# Patient Record
Sex: Male | Born: 1995 | Race: White | Hispanic: No | Marital: Single | Smoking: Never smoker
Health system: Southern US, Community
[De-identification: ages and names within clinical notes are randomized; demographics above are authoritative.]

---
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
## 2017-03-20 ENCOUNTER — Other Ambulatory Visit: Payer: Self-pay | Admitting: Occupational Medicine

## 2017-03-20 ENCOUNTER — Ambulatory Visit: Payer: Self-pay

## 2017-03-20 DIAGNOSIS — M25562 Pain in left knee: Secondary | ICD-10-CM

## 2020-04-13 ENCOUNTER — Other Ambulatory Visit: Payer: Self-pay

## 2020-04-13 ENCOUNTER — Ambulatory Visit: Admit: 2020-04-13 | Discharge status: Absent without leave | Payer: Self-pay

## 2020-04-13 ENCOUNTER — Ambulatory Visit
Admission: EM | Admit: 2020-04-13 | Discharge: 2020-04-13 | Disposition: A | Discharge status: Home / Self Care | Payer: Self-pay

## 2020-04-13 DIAGNOSIS — J329 Chronic sinusitis, unspecified: Secondary | ICD-10-CM

## 2020-04-13 DIAGNOSIS — R0981 Nasal congestion: Secondary | ICD-10-CM

## 2020-04-13 DIAGNOSIS — R519 Headache, unspecified: Secondary | ICD-10-CM

## 2020-04-13 DIAGNOSIS — B9789 Other viral agents as the cause of diseases classified elsewhere: Secondary | ICD-10-CM

## 2020-04-13 NOTE — ED Provider Notes (Signed)
[facility]   [id number] 04/13/20 Arrival Time: 1559  MV:[facility] THROAT  SUBJECTIVE: History from: patient.  John Sutton is a 24 y.o. male who presents with abrupt onset of nasal congestion with dark green and brown nasal discharge intermittently and intermittent headache for the last 2 days. Had Covid last month.  Denies sick exposure to Covid, strep, flu or mono, or precipitating event. Has not had Covid vaccines. Has not attempted OTC treatment. Has not had flu vaccine this year. There are no aggravating symptoms. Denies previous symptoms in the past.     Denies fever, chills, ear pain, sinus pain, rhinorrhea, cough, SOB, wheezing, chest pain, nausea, rash, changes in bowel or bladder habits.     ROS: As per HPI.  All other pertinent ROS negative.     History reviewed. No pertinent past medical history. History reviewed. No pertinent surgical history. Allergies  Allergen Reactions  . Strawberry Extract Rash   No current facility-administered medications on file prior to encounter.   Current Outpatient Medications on File Prior to Encounter  Medication Sig Dispense Refill  . ibuprofen (ADVIL,MOTRIN) 800 MG tablet Take 1 tablet (800 mg total) by mouth 3 (three) times daily. 21 tablet 0   Social History   Socioeconomic History  . Marital status: Single    Spouse name: Not on file  . Number of children: Not on file  . Years of education: Not on file  . Highest education level: Not on file  Occupational History  . Not on file  Tobacco Use  . Smoking status: Never Smoker  . Smokeless tobacco: Never Used  Substance and Sexual Activity  . Alcohol use: No  . Drug use: No  . Sexual activity: Not on file  Other Topics Concern  . Not on file  Social History Narrative  . Not on file   Social Determinants of Health   Financial Resource Strain: Not on file  Food Insecurity: Not on file  Transportation Needs: Not on file  Physical Activity: Not on file  Stress:  Not on file  Social Connections: Not on file  Intimate Partner Violence: Not on file   No family history on file.  OBJECTIVE:  Vitals:   04/13/20 1752 04/13/20 1753  BP:  135/87  Pulse:  74  Resp:  18  Temp:  98.5 F (36.9 C)  TempSrc:  Oral  SpO2:  96%  Weight: 235 lb (106.6 kg)   Height: 6\' 5"  (1.956 m)      General appearance: alert; appears fatigued, but nontoxic, speaking in full sentences and managing own secretions HEENT: NCAT; Ears: EACs clear, TMs pearly gray with visible cone of light, without erythema; Eyes: PERRL, EOMI grossly; Nose: no obvious rhinorrhea; Throat: oropharynx erythematous, cobblestoning present, tonsils 1+ and mildly erythematous without white tonsillar exudates, uvula midline; Sinuses: tender to palpation Neck: supple without LAD Lungs: CTA bilaterally without adventitious breath sounds; cough absent Heart: regular rate and rhythm.  Radial pulses 2+ symmetrical bilaterally Skin: warm and dry Psychological: alert and cooperative; normal mood and affect  LABS: No results found for this or any previous visit (from the past 24 hour(s)).   ASSESSMENT & PLAN:  1. Viral sinusitis   2. Nasal congestion   3. Nonintractable headache, unspecified chronicity pattern, unspecified headache type    May take zyrted D  May use flonase Drink warm or cool liquids, use throat lozenges, or popsicles to help alleviate symptoms Take OTC ibuprofen or tylenol as needed for pain Follow up  on Monday if symptoms are persisting  Return or go to ER if you have any new or worsening symptoms such as fever, chills, nausea, vomiting, worsening sore throat, cough, abdominal pain, chest pain, changes in bowel or bladder habits.   Reviewed expectations re: course of current medical issues. Questions answered. Outlined signs and symptoms indicating need for more acute intervention. Patient verbalized understanding. After Visit Summary given.          Moshe Cipro, NP 04/16/20 1646

## 2020-04-13 NOTE — Discharge Instructions (Signed)
I would have you take zyrtec D and use flonase through the weekend  If things are not improving by Monday, let us know  Follow up with this office or with primary care if symptoms are persisting.  Follow up in the ER for high fever, trouble swallowing, trouble breathing, other concerning symptoms.

## 2020-04-13 NOTE — ED Triage Notes (Signed)
Brownish/red mucous when he blows his nose and headache x 1 1/2 days

## 2020-04-14 ENCOUNTER — Ambulatory Visit: Payer: Self-pay

## 2020-08-22 ENCOUNTER — Emergency Department (HOSPITAL_COMMUNITY)
Admission: EM | Admit: 2020-08-22 | Discharge: 2020-08-22 | Disposition: A | Discharge status: Home / Self Care | Payer: Self-pay | Attending: Emergency Medicine | Admitting: Emergency Medicine

## 2020-08-22 ENCOUNTER — Other Ambulatory Visit: Payer: Self-pay

## 2020-08-22 ENCOUNTER — Emergency Department (HOSPITAL_COMMUNITY): Payer: Self-pay

## 2020-08-22 ENCOUNTER — Encounter (HOSPITAL_COMMUNITY): Payer: Self-pay | Admitting: Emergency Medicine

## 2020-08-22 DIAGNOSIS — R079 Chest pain, unspecified: Secondary | ICD-10-CM

## 2020-08-22 DIAGNOSIS — R0789 Other chest pain: Secondary | ICD-10-CM | POA: Insufficient documentation

## 2020-08-22 MED ORDER — ALUM & MAG HYDROXIDE-SIMETH 200-200-20 MG/5ML PO SUSP
30.0000 mL | Freq: Once | ORAL | Status: AC
  Administered 2020-08-22: 30 mL via ORAL
  Filled 2020-08-22: qty 30

## 2020-08-22 MED ORDER — SUCRALFATE 1 G PO TABS: 1.0000 g | ORAL_TABLET | Freq: Four times a day (QID) | ORAL | 0 refills | Status: AC | PRN

## 2020-08-22 NOTE — ED Triage Notes (Signed)
Pt c/o chest pain that gets worse when he lays down. Pt c/o left sided chest pressure.

## 2020-08-22 NOTE — Discharge Instructions (Addendum)
You were evaluated in the Emergency Department and after careful evaluation, we did not find any emergent condition requiring admission or further testing in the hospital.  Your exam/testing today was overall reassuring.  Symptoms seem to be due to acid reflux.  Can use the Carafate as needed for discomfort.  Please return to the Emergency Department if you experience any worsening of your condition.  Thank you for allowing [place] to be a part of your care.

## 2020-08-22 NOTE — ED Provider Notes (Signed)
[facility]-EMERGENCY DEPT [facility] Emergency Department Provider Note MRN:  [id number]  Arrival date & time: 08/22/20     Chief Complaint   Chest Pain   History of Present Illness   John Sutton is a 25 y.o. year-old male with no pertinent past medical history presenting to the ED with chief complaint of chest pain.  Location: Central chest, nonradiating Duration: 1 day Onset: Sudden while sleeping yesterday morning Timing: Intermittent throughout the day Description: After sharp but then a pressure-like pain Severity: Moderate to severe Exacerbating/Alleviating Factors: Much worse when laying flat, resolves when standing up Associated Symptoms: None Pertinent Negatives: Denies dizziness or diaphoresis, no nausea vomiting, no trouble breathing, no abdominal pain, no numbness or weakness to the arms or legs.   Review of Systems  A complete 10 system review of systems was obtained and all systems are negative except as noted in the HPI and PMH.   Patient's Health History   History reviewed. No pertinent past medical history.  History reviewed. No pertinent surgical history.  No family history on file.  Social History   Socioeconomic History  . Marital status: Single    Spouse name: Not on file  . Number of children: Not on file  . Years of education: Not on file  . Highest education level: Not on file  Occupational History  . Not on file  Tobacco Use  . Smoking status: Never Smoker  . Smokeless tobacco: Never Used  Substance and Sexual Activity  . Alcohol use: No  . Drug use: No  . Sexual activity: Not on file  Other Topics Concern  . Not on file  Social History Narrative   ** Merged History Encounter **       Social Determinants of Health   Financial Resource Strain: Not on file  Food Insecurity: Not on file  Transportation Needs: Not on file  Physical Activity: Not on file  Stress: Not on file  Social Connections: Not on file  Intimate Partner Violence:  Not on file     Physical Exam   Vitals:   08/22/20 0022 08/22/20 0030  BP: (!) 141/90 (!) 145/89  Pulse:  95  Resp: 18 16    CONSTITUTIONAL: Well-appearing, NAD NEURO:  Alert and oriented x 3, no focal deficits EYES:  eyes equal and reactive ENT/NECK:  no LAD, no JVD CARDIO: Regular rate, well-perfused, normal S1 and S2 PULM:  CTAB no wheezing or rhonchi GI/GU:  normal bowel sounds, non-distended, non-tender MSK/SPINE:  No gross deformities, no edema SKIN:  no rash, atraumatic PSYCH:  Appropriate speech and behavior  *Additional and/or pertinent findings included in MDM below  Diagnostic and Interventional Summary    EKG Interpretation  Date/Time:  Tuesday Aug 22 2020 00:21:30 EDT Ventricular Rate:  102 PR Interval:  169 QRS Duration: 95 QT Interval:  329 QTC Calculation: 429 R Axis:   62 Text Interpretation: Sinus tachycardia LAE, consider biatrial enlargement Confirmed by Kennis Carina [phone]) on 08/22/2020 12:35:57 AM      Labs Reviewed - No data to display  DG Chest 2 View  Final Result      Medications  alum & mag hydroxide-simeth (MAALOX/MYLANTA) 200-200-20 MG/5ML suspension 30 mL (30 mLs Oral Given 08/22/20 0040)     Procedures  /  Critical Care Procedures  ED Course and Medical Decision Making  I have reviewed the triage vital signs, the nursing notes, and pertinent available records from the EMR.  Listed above are laboratory and imaging tests that  I personally ordered, reviewed, and interpreted and then considered in my medical decision making (see below for details).  History is highly suggestive of GI etiology such as GERD or esophageal spasm.  Pericarditis could also cause this chest pain improved when sitting forward, though EKG would not support this diagnosis.  Overall patient is very well-appearing with reassuring vital signs, equal pulses bilaterally.  Highly doubt PE or dissection or ACS.  Awaiting chest x-ray, providing GI cocktail and will  reassess.     Patient's pain is improved after GI cocktail.  Chest x-ray is normal.  Appropriate for discharge.  Elmer Sow. Pilar Plate, MD [facility] [email or web address]  Final Clinical Impressions(s) / ED Diagnoses     ICD-10-CM   1. Chest pain, unspecified type  R07.9     ED Discharge Orders         Ordered    sucralfate (CARAFATE) 1 g tablet  4 times daily PRN        08/22/20 0157           Discharge Instructions Discussed with and Provided to Patient:     Discharge Instructions     You were evaluated in the Emergency Department and after careful evaluation, we did not find any emergent condition requiring admission or further testing in the hospital.  Your exam/testing today was overall reassuring.  Symptoms seem to be due to acid reflux.  Can use the Carafate as needed for discomfort.  Please return to the Emergency Department if you experience any worsening of your condition.  Thank you for allowing [place] to be a part of your care.        Sabas Sous, MD 08/22/20 [phone]

## 2022-12-17 IMAGING — DX DG CHEST 2V
2 series · 3 of 3 positions shown · non-contrast
Comparison: Chest x-ray 06/03/2009

CLINICAL DATA: Chest pain.

EXAM:
CHEST - 2 VIEW

[chest pa]
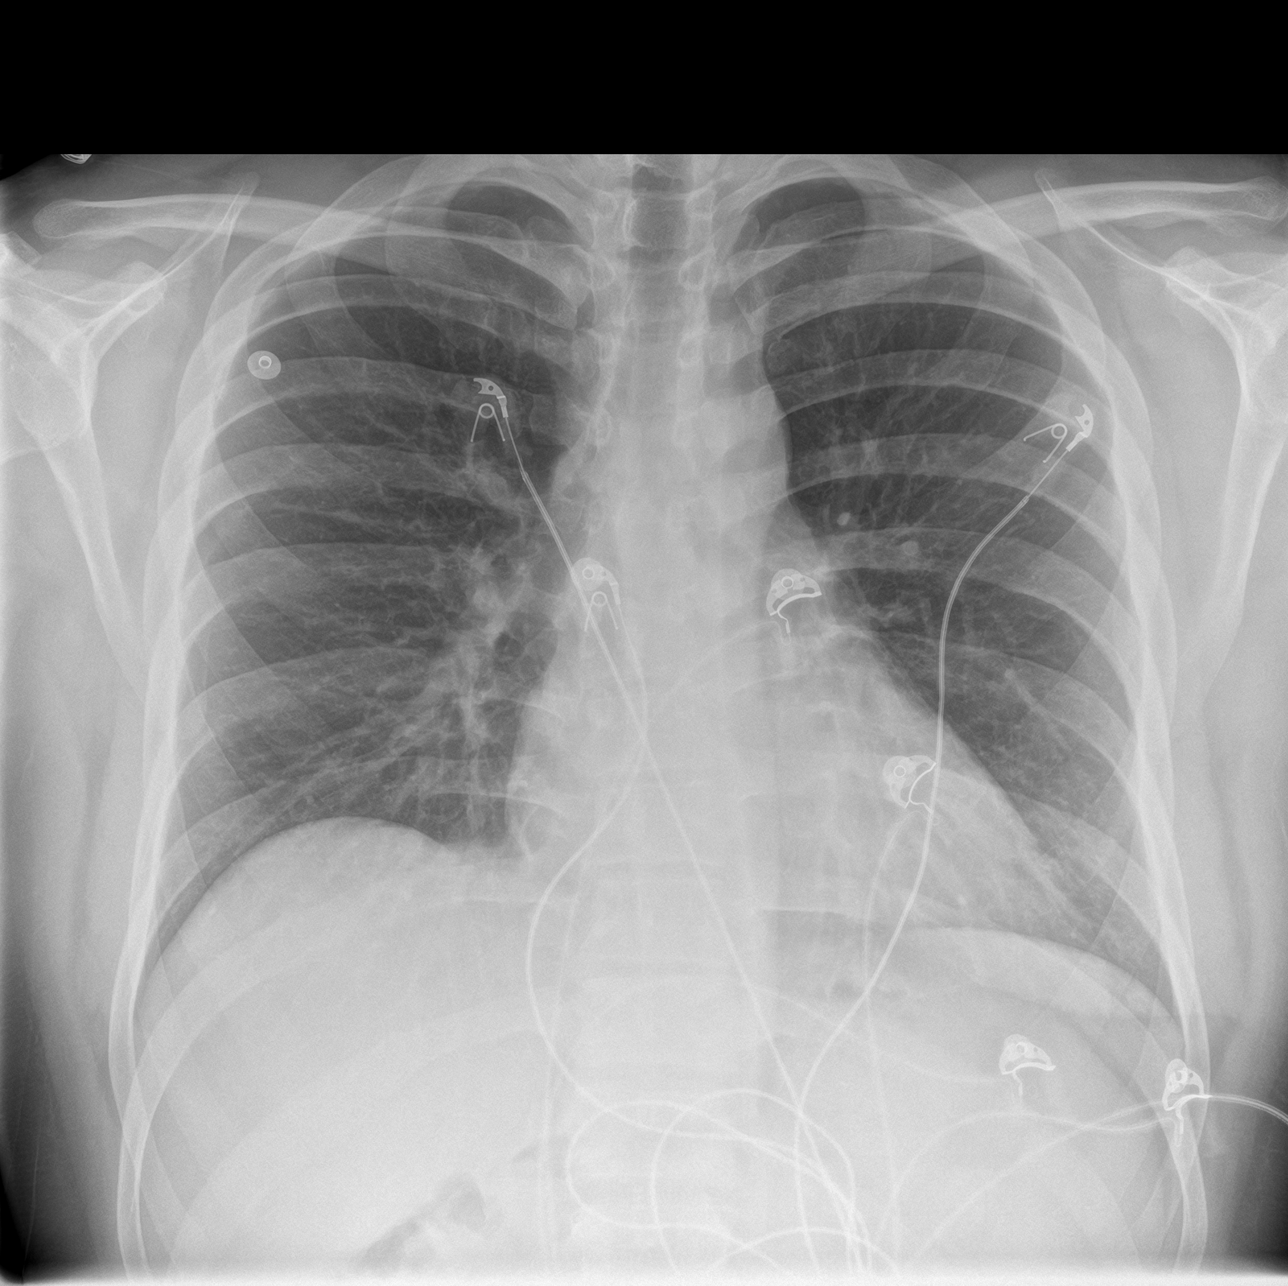

[Series 2: chest lat · 0.14mm/px · 2 of 2 slices shown]
[im 1/2]
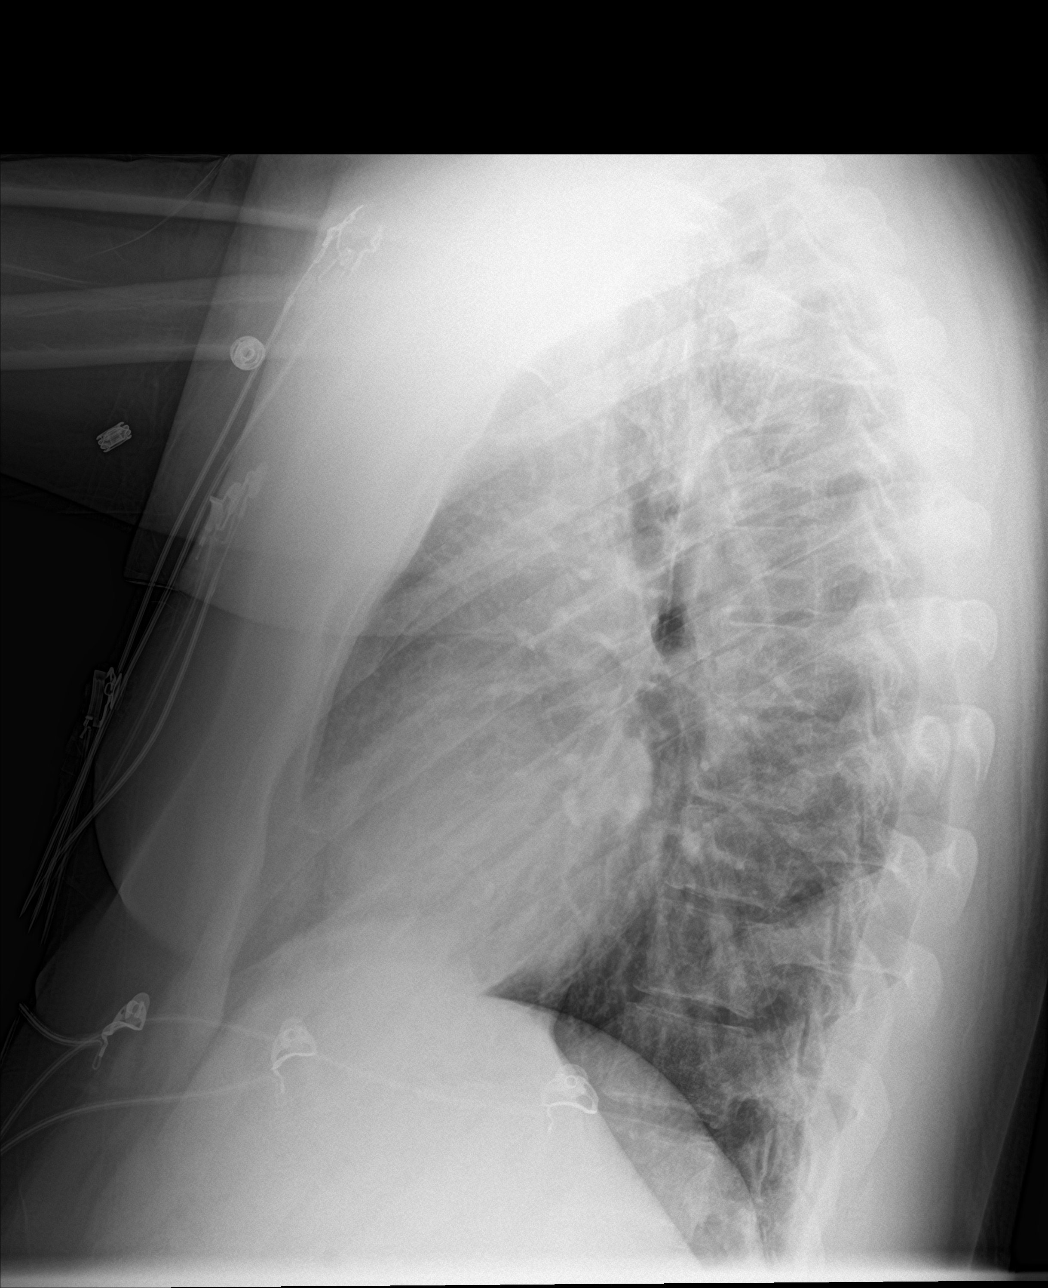
[im 2/2]
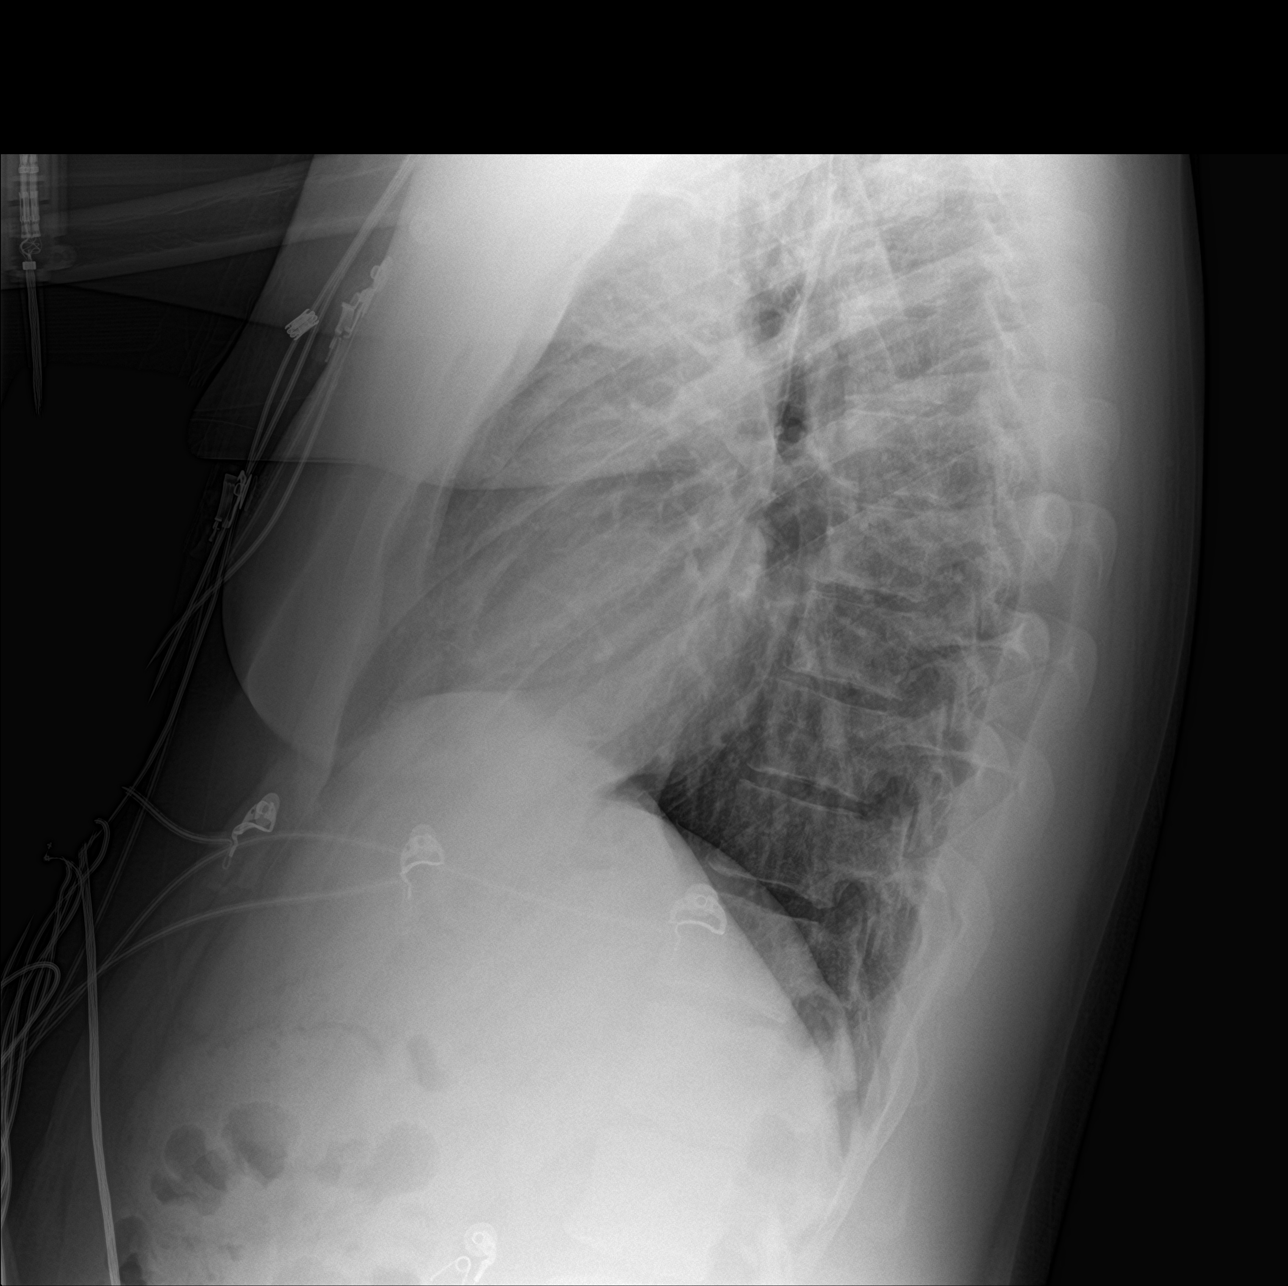

[3 of 3 positions shown; findings below may reference images not displayed]

FINDINGS: The heart size and mediastinal contours are within normal limits.

Interval development of trace biapical pleural/pulmonary scarring.
No focal consolidation. No pulmonary edema. No pleural effusion. No
pneumothorax.

No acute osseous abnormality.
IMPRESSION: No active cardiopulmonary disease.
